# Patient Record
Sex: Male | Born: 1970 | Race: White | Hispanic: No | Marital: Married | Smoking: Never smoker
Health system: Southern US, Community
[De-identification: ages and names within clinical notes are randomized; demographics above are authoritative.]

---

## 2015-02-27 ENCOUNTER — Ambulatory Visit
Admit: 2015-02-27 | Discharge: 2015-02-27 | Disposition: A | Discharge status: Home / Self Care | Payer: BLUE CROSS/BLUE SHIELD | Attending: Family Medicine | Admitting: Family Medicine

## 2015-02-27 ENCOUNTER — Encounter: Payer: Self-pay | Admitting: Emergency Medicine

## 2015-02-27 ENCOUNTER — Ambulatory Visit
Admission: EM | Admit: 2015-02-27 | Discharge: 2015-02-27 | Disposition: A | Discharge status: Home / Self Care | Payer: BLUE CROSS/BLUE SHIELD | Attending: Family Medicine | Admitting: Family Medicine

## 2015-02-27 DIAGNOSIS — J020 Streptococcal pharyngitis: Secondary | ICD-10-CM | POA: Diagnosis not present

## 2015-02-27 DIAGNOSIS — E279 Disorder of adrenal gland, unspecified: Secondary | ICD-10-CM | POA: Insufficient documentation

## 2015-02-27 DIAGNOSIS — K5730 Diverticulosis of large intestine without perforation or abscess without bleeding: Secondary | ICD-10-CM | POA: Insufficient documentation

## 2015-02-27 DIAGNOSIS — K760 Fatty (change of) liver, not elsewhere classified: Secondary | ICD-10-CM | POA: Insufficient documentation

## 2015-02-27 DIAGNOSIS — R1031 Right lower quadrant pain: Secondary | ICD-10-CM | POA: Diagnosis not present

## 2015-02-27 DIAGNOSIS — R509 Fever, unspecified: Secondary | ICD-10-CM

## 2015-02-27 LAB — COMPREHENSIVE METABOLIC PANEL
ALBUMIN: 4.9 g/dL (ref 3.5–5.0)
ALT: 43 U/L (ref 17–63)
ANION GAP: 10 (ref 5–15)
AST: 29 U/L (ref 15–41)
Alkaline Phosphatase: 54 U/L (ref 38–126)
BILIRUBIN TOTAL: 0.8 mg/dL (ref 0.3–1.2)
BUN: 19 mg/dL (ref 6–20)
CHLORIDE: 103 mmol/L (ref 101–111)
CO2: 24 mmol/L (ref 22–32)
Calcium: 9.4 mg/dL (ref 8.9–10.3)
Creatinine, Ser: 1.09 mg/dL (ref 0.61–1.24)
GFR calc Af Amer: >60 mL/min (ref >60)
GFR calc non Af Amer: >60 mL/min (ref >60)
GLUCOSE: 125 mg/dL — AB (ref 65–99)
POTASSIUM: 3.7 mmol/L (ref 3.5–5.1)
SODIUM: 137 mmol/L (ref 135–145)
TOTAL PROTEIN: 7.8 g/dL (ref 6.5–8.1)

## 2015-02-27 LAB — CBC WITH DIFFERENTIAL/PLATELET
BASOS ABS: 0.1 10*3/uL (ref 0–0.1)
BASOS PCT: 1 %
EOS ABS: 0.1 10*3/uL (ref 0–0.7)
Eosinophils Relative: 0 %
HEMATOCRIT: 52.8 % — AB (ref 40.0–52.0)
Hemoglobin: 18.5 g/dL — ABNORMAL HIGH (ref 13.0–18.0)
Lymphocytes Relative: 7 %
Lymphs Abs: 1.4 10*3/uL (ref 1.0–3.6)
MCH: 30.3 pg (ref 26.0–34.0)
MCHC: 35.1 g/dL (ref 32.0–36.0)
MCV: 86.2 fL (ref 80.0–100.0)
MONO ABS: 1.5 10*3/uL — AB (ref 0.2–1.0)
MONOS PCT: 8 %
NEUTROS ABS: 17.4 10*3/uL — AB (ref 1.4–6.5)
Neutrophils Relative %: 84 %
PLATELETS: 229 10*3/uL (ref 150–440)
RBC: 6.12 MIL/uL — ABNORMAL HIGH (ref 4.40–5.90)
RDW: 13.2 % (ref 11.5–14.5)
WBC: 20.6 10*3/uL — ABNORMAL HIGH (ref 3.8–10.6)

## 2015-02-27 LAB — URINALYSIS COMPLETE WITH MICROSCOPIC (ARMC ONLY)
Bacteria, UA: NONE SEEN — AB
Bilirubin Urine: NEGATIVE
Glucose, UA: 100 mg/dL — AB
Hgb urine dipstick: NEGATIVE
KETONES UR: NEGATIVE mg/dL
LEUKOCYTES UA: NEGATIVE
Nitrite: NEGATIVE
PH: 5 (ref 5.0–8.0)
PROTEIN: NEGATIVE mg/dL
RBC / HPF: NONE SEEN RBC/hpf (ref <3)
SPECIFIC GRAVITY, URINE: 1.025 (ref 1.005–1.030)
SQUAMOUS EPITHELIAL / LPF: NONE SEEN — AB

## 2015-02-27 LAB — RAPID STREP SCREEN (MED CTR MEBANE ONLY): STREPTOCOCCUS, GROUP A SCREEN (DIRECT): POSITIVE — AB

## 2015-02-27 MED ORDER — IBUPROFEN 800 MG PO TABS
800.0000 mg | ORAL_TABLET | Freq: Once | ORAL | Status: AC
  Administered 2015-02-27: 800 mg via ORAL

## 2015-02-27 MED ORDER — IOHEXOL 300 MG/ML  SOLN
100.0000 mL | Freq: Once | INTRAMUSCULAR | Status: AC | PRN
  Administered 2015-02-27: 100 mL via INTRAVENOUS

## 2015-02-27 MED ORDER — AMOXICILLIN 875 MG PO TABS: 875.0000 mg | ORAL_TABLET | Freq: Two times a day (BID) | ORAL | Status: AC

## 2015-02-27 NOTE — ED Provider Notes (Signed)
[facility] Emergency Department Provider Note  ____________________________________________  Time seen: Approximately 4:44 PM  I have reviewed the triage vital signs and the nursing notes.   HISTORY  Chief Complaint Fever and Abdominal Pain   HPI Ryan Obrien is a 44 y.o. male presents for complaints of fever. Reports fever max today 102.5. States fever x one day. States took 2 Tylenol just prior to arrival. Patient reports visiting area for work, and states work up this am with slight sore throat and felt feverish. Also reports last week as getting out of bed he twisting awkwardly and pulled muscle in right groin. States he has had some intermittent right groin pain since and states pain was only with movement. States today felt that he has had some increased right groin pain and seems to be present during sitting as well as some movements. States has continued to eat and drink today, last ate 1230 pm. Denies vomiting, nausea, diarrhea or constipation. Denies sick contacts. Denies chest pain, shortness of breath or back pain. Denies dysuria, diarrhea or constipation. Denies bulging or masses in groin or abdomen. Denies recent strenuous activity, fall or injury. Denies pain radiation. Denies testicular or penile pain, swelling or discomfort.Denies rash, insect bites.   History reviewed. No pertinent past medical history.  There are no active problems to display for this patient.   History reviewed. No pertinent past surgical history.  Current Outpatient Rx  Name  Route  Sig  Dispense  Refill  . acetaminophen (TYLENOL) 500 MG tablet   Oral   Take 1,000 mg by mouth every 6 (six) hours as needed.           Allergies Review of patient's allergies indicates no known allergies.  History reviewed. No pertinent family history.  Social History Social History  Substance Use Topics  . Smoking status: Never Smoker   . Smokeless tobacco: None  . Alcohol Use:  Yes    Review of Systems Constitutional: Positive for fever. Eyes: No visual changes. ENT:  positive for sore throat. Cardiovascular: Denies chest pain. Respiratory: Denies shortness of breath. Gastrointestinal:  positive for abdominal pain and right groin pain as above. No nausea, no vomiting.  No diarrhea.  No constipation. Genitourinary: Negative for dysuria. Musculoskeletal: Negative for back pain. Skin: Negative for rash. Neurological: Negative for headaches, focal weakness or numbness.  10-point ROS otherwise negative.  ____________________________________________   PHYSICAL EXAM:  VITAL SIGNS: ED Triage Vitals  Enc Vitals Group     BP 02/27/15 1519 129/86 mmHg     Pulse Rate 02/27/15 1519 91     Resp 02/27/15 1519 16     Temp 02/27/15 1519 101.4 F (38.6 C)     Temp Source 02/27/15 1519 Oral     SpO2 02/27/15 1519 100 %     Weight 02/27/15 1519 224 lb (101.606 kg)     Height 02/27/15 1519 6' 1"  (1.854 m)     Head Cir --      [facility] --      Pain Score 02/27/15 1522 4     Pain Loc --      Pain Edu? --      Excl. in [place]   02/27/15 1519 02/27/15 1522 02/27/15 1711 02/27/15 1715  BP: 129/86  125/90   Pulse: 91  70   Temp: 101.4 F (38.6 C)  99.1 F (37.3 C)   TempSrc: Oral  Oral   Resp: 16  16   Height: 6' 1"  (1.854 m)     Weight: 224 lb (101.606 kg)     SpO2: 100%  100%   PainSc:  4   3     Constitutional: Alert and oriented. Well appearing and in no acute distress. Eyes: Conjunctivae are normal. PERRL. EOMI. Head: Atraumatic.  Ears: no erythema, normal TMs bilaterally.   Nose: No congestion/rhinnorhea.  Mouth/Throat: Mucous membranes are moist.  Mild pharyngeal erythema and tonsillar swelling. No tonsillar exudate. No uvular deviation or shift.  Neck: No stridor.  No cervical spine tenderness to palpation. Hematological/Lymphatic/Immunilogical: No cervical lymphadenopathy. Cardiovascular: Normal rate, regular rhythm. Grossly  normal heart sounds.  Good peripheral circulation. Respiratory: Normal respiratory effort.  No retractions. Lungs CTAB. Gastrointestinal: Soft. Right lower quadrant abdomen with intermittent mild TTP to palpation, right inguinal mild TTP intermittently, neither consistent. Pain unchanged with hip flexion, right leg abduction or adduction. Negative Psoas sign. Negative obturator sign. Mild rovsings sign. . No distention. Normal Bowel sounds.  No abdominal bruits. No CVA tenderness. Musculoskeletal: No lower or upper extremity tenderness nor edema.  No joint effusions. Bilateral pedal pulses equal and easily palpated.  Rectal/genitourinary: With RN Clair Gulling at bedside.  Nontender. Normal brown stool coloration. No testicular swelling or pain. No masses, bulges. Examined supine and standing.  Neurologic:  Normal speech and language. No gross focal neurologic deficits are appreciated. No gait instability. Skin:  Skin is warm, dry and intact. No rash noted. Psychiatric: Mood and affect are normal. Speech and behavior are normal.  ____________________________________________   LABS (all labs ordered are listed, but only abnormal results are displayed)  Labs Reviewed  RAPID STREP SCREEN (NOT AT [facility]) - Abnormal; Notable for the following:    Streptococcus, Group A Screen (Direct) POSITIVE (*)    All other components within normal limits  URINALYSIS COMPLETEWITH MICROSCOPIC ([facility] ONLY) - Abnormal; Notable for the following:    Glucose, UA 100 (*)    Bacteria, UA NONE SEEN (*)    Squamous Epithelial / LPF NONE SEEN (*)    All other components within normal limits  CBC WITH DIFFERENTIAL/PLATELET - Abnormal; Notable for the following:    WBC 20.6 (*)    RBC 6.12 (*)    Hemoglobin 18.5 (*)    HCT 52.8 (*)    Neutro Abs 17.4 (*)    Monocytes Absolute 1.5 (*)    All other components within normal limits  COMPREHENSIVE METABOLIC PANEL - Abnormal; Notable for the following:    Glucose, Bld 125 (*)     All other components within normal limits   RADIOLOGY EXAM: CT ABDOMEN AND PELVIS WITH CONTRAST  TECHNIQUE: Multidetector CT imaging of the abdomen and pelvis was performed using the standard protocol following bolus administration of intravenous contrast.  CONTRAST: 117m OMNIPAQUE IOHEXOL 300 MG/ML SOLN  COMPARISON: None.  FINDINGS: Lower chest: Mild bibasilar atelectasis  Hepatobiliary: Mild hepatic steatosis  Pancreas: negative  Spleen: negative  Adrenals/Urinary Tract: 2 cm right adrenal mass of average attenuation value of 25. Otherwise negative.  Stomach/Bowel: Appendix normal. Sigmoid diverticulosis.  Vascular/Lymphatic: negative  Reproductive: negative  Other: negative  Musculoskeletal: negative  IMPRESSION: Mild hepatic steatosis.  Sigmoid diverticulosis without acute inflammation.  2cm right adrenal mass, possibly an adenoma. Recommend Adrenal-Protocol MRI to confirm this.  These results will be called to the ordering clinician or representative by the Radiology Department at the imaging location.   Electronically Signed  By: RSkipper ClicheM.D.  On: 02/27/2015 18:53  ____________________________________________  INITIAL IMPRESSION / ASSESSMENT AND PLAN / ED COURSE  Pertinent labs & imaging results that were available during my care of the patient were reviewed by me and considered in my medical decision making (see chart for details).  Presents for one day of sore throat, fever and intermittent right groin and right abdominal pain. Will evaluate for strep throat. Abdominal and right inguinal exam with inconsistent tenderness to palpation. Will treat fever with oral ibuprofen and evaluate cbc and cmp.  Strep swab positive. Abdominal and right inguinal exam with TTP remains inconsistent with pain present then denies pain to palpation. Labs reviewed. WBC 20.6. Patient with positive strep throat but also with  elevated WBC concern for possible appendicitis as intermittent vague right lower abdominal pain. Discussed patient and plan of care with Dr Alveta Heimlich who also agrees to plan. Discussed with patient plan of care and ability to further evaluate. Counseled regarding Ct abdomen and pelvis for further evaluation, vs treat for strep and very close monitoring. Patient reports would like to proceed with CT abdomen at this time.   Unable to Ct at this facility at this time. RN to call to schedule for outpatient CT abdomen and pelvis. Patient verbalized understanding and agreed to plan. Patient alert and oriented with decisional capacity. Patient coworker at bedside to transport patient. Awaiting CT results. ____________________________________________   FINAL CLINICAL IMPRESSION(S) / ED DIAGNOSES  Final diagnoses:  Strep throat  Fever, unspecified fever cause  Right lower quadrant abdominal pain       Marylene Land, NP 02/27/15 1841   ADDENDUM 02/27/2015  1900  CT results reviewed. See full report below. Called and discussed findings with patient. Further discussed incidental CT findings with patient. Patient also verbalizes he will stop by urgent care in the next 2 days for full report of the CT to take to his primary care physician for follow-up. CT abdomen with normal appendix. CT abdomen and pelvis showing sigmoid diverticulosis without acute inflammation, 2 cm right adrenal mass, possibly an adenoma and radiologist recommended adrenal protocol MRI to confirm this, mild hepatic steatosis. Patient verbalized understanding and reports he will follow-up with his primary care physician within a week. States that he returns home this Friday.  Reports that he is feeling well. Will treat strep throat with oral amoxicillin, prescription sent to [facility] in [place] per patient request. . Discussed supportive treatments including when necessary Tylenol or ibuprofen and rest. Encourage fluids. Discussed strict  follow-up and return parameters. suspect right groin and right lower abdominal pain which is intermittent and worse with movement likely musculoskeletal strain per subjective report above. Discussed strict follow up and return parameters, including PCP follow up within one week. Discussed returning for weakness, fever not responding to medication, increased pain, difficulty swallowing, new or worsening concerns.  Discussed follow-up and return parameters. Patient verbalized understanding and agreed to plan.   EXAM: CT ABDOMEN AND PELVIS WITH CONTRAST  TECHNIQUE: Multidetector CT imaging of the abdomen and pelvis was performed using the standard protocol following bolus administration of intravenous contrast.  CONTRAST: 163m OMNIPAQUE IOHEXOL 300 MG/ML SOLN  COMPARISON: None.  FINDINGS: Lower chest: Mild bibasilar atelectasis  Hepatobiliary: Mild hepatic steatosis  Pancreas: negative  Spleen: negative  Adrenals/Urinary Tract: 2 cm right adrenal mass of average attenuation value of 25. Otherwise negative.  Stomach/Bowel: Appendix normal. Sigmoid diverticulosis.  Vascular/Lymphatic: negative  Reproductive: negative  Other: negative  Musculoskeletal: negative  IMPRESSION: Mild hepatic steatosis.  Sigmoid diverticulosis without acute inflammation.  2cm right adrenal mass,  possibly an adenoma. Recommend Adrenal-Protocol MRI to confirm this.  These results will be called to the ordering clinician or representative by the Radiology Department at the imaging location.   Electronically Signed  By: Skipper Cliche M.D.  On: 02/27/2015 18:53  Marylene Land, NP 02/27/15 2214

## 2015-02-27 NOTE — ED Notes (Signed)
Patient c/o RLQ abdominal pain and fever since last night.

## 2015-02-27 NOTE — Discharge Instructions (Signed)
Proceed to hospital as directed for Ct abdomen for further evaluation. Do not eat or drink. Stay at hospital until further directed.    Abdominal Pain Many things can cause abdominal pain. Usually, abdominal pain is not caused by a disease and will improve without treatment. It can often be observed and treated at home. Your health care provider will do a physical exam and possibly order blood tests and X-rays to help determine the seriousness of your pain. However, in many cases, more time must pass before a clear cause of the pain can be found. Before that point, your health care provider may not know if you need more testing or further treatment. HOME CARE INSTRUCTIONS  Monitor your abdominal pain for any changes. The following actions may help to alleviate any discomfort you are experiencing:  Only take over-the-counter or prescription medicines as directed by your health care provider.  Do not take laxatives unless directed to do so by your health care provider.  Try a clear liquid diet (broth, tea, or water) as directed by your health care provider. Slowly move to a bland diet as tolerated. SEEK MEDICAL CARE IF:  You have unexplained abdominal pain.  You have abdominal pain associated with nausea or diarrhea.  You have pain when you urinate or have a bowel movement.  You experience abdominal pain that wakes you in the night.  You have abdominal pain that is worsened or improved by eating food.  You have abdominal pain that is worsened with eating fatty foods.  You have a fever. SEEK IMMEDIATE MEDICAL CARE IF:   Your pain does not go away within 2 hours.  You keep throwing up (vomiting).  Your pain is felt only in portions of the abdomen, such as the right side or the left lower portion of the abdomen.  You pass bloody or black tarry stools. MAKE SURE YOU:  Understand these instructions.   Will watch your condition.   Will get help right away if you are not doing  well or get worse.  Document Released: 03/12/2005 Document Revised: 06/07/2013 Document Reviewed: 02/09/2013 [facility] Patient Information 2015 [place], [place]. This information is not intended to replace advice given to you by your health care provider. Make sure you discuss any questions you have with your health care provider.  Strep Throat Strep throat is an infection of the throat caused by a bacteria named Streptococcus pyogenes. Your health care provider may call the infection streptococcal "tonsillitis" or "pharyngitis" depending on whether there are signs of inflammation in the tonsils or back of the throat. Strep throat is most common in children aged 5-15 years during the cold months of the year, but it can occur in people of any age during any season. This infection is spread from person to person (contagious) through coughing, sneezing, or other close contact. SIGNS AND SYMPTOMS   Fever or chills.  Painful, swollen, red tonsils or throat.  Pain or difficulty when swallowing.  White or yellow spots on the tonsils or throat.  Swollen, tender lymph nodes or "glands" of the neck or under the jaw.  Red rash all over the body (rare). DIAGNOSIS  Many different infections can cause the same symptoms. A test must be done to confirm the diagnosis so the right treatment can be given. A "rapid strep test" can help your health care provider make the diagnosis in a few minutes. If this test is not available, a light swab of the infected area can be used for a  throat culture test. If a throat culture test is done, results are usually available in a day or two. TREATMENT  Strep throat is treated with antibiotic medicine. HOME CARE INSTRUCTIONS   Gargle with 1 tsp of salt in 1 cup of warm water, 3-4 times per day or as needed for comfort.  Family members who also have a sore throat or fever should be tested for strep throat and treated with antibiotics if they have the strep infection.  Make  sure everyone in your household washes their hands well.  Do not share food, drinking cups, or personal items that could cause the infection to spread to others.  You may need to eat a soft food diet until your sore throat gets better.  Drink enough water and fluids to keep your urine clear or pale yellow. This will help prevent dehydration.  Get plenty of rest.  Stay home from school, day care, or work until you have been on antibiotics for 24 hours.  Take medicines only as directed by your health care provider.  Take your antibiotic medicine as directed by your health care provider. Finish it even if you start to feel better. SEEK MEDICAL CARE IF:   The glands in your neck continue to enlarge.  You develop a rash, cough, or earache.  You cough up green, yellow-brown, or bloody sputum.  You have pain or discomfort not controlled by medicines.  Your problems seem to be getting worse rather than better.  You have a fever. SEEK IMMEDIATE MEDICAL CARE IF:   You develop any new symptoms such as vomiting, severe headache, stiff or painful neck, chest pain, shortness of breath, or trouble swallowing.  You develop severe throat pain, drooling, or changes in your voice.  You develop swelling of the neck, or the skin on the neck becomes red and tender.  You develop signs of dehydration, such as fatigue, dry mouth, and decreased urination.  You become increasingly sleepy, or you cannot wake up completely. MAKE SURE YOU:  Understand these instructions.  Will watch your condition.  Will get help right away if you are not doing well or get worse. Document Released: 05/30/2000 Document Revised: 10/17/2013 Document Reviewed: 08/01/2010 [facility] Patient Information 2015 [place], [place]. This information is not intended to replace advice given to you by your health care provider. Make sure you discuss any questions you have with your health care provider.

## 2016-11-05 IMAGING — CT CT ABD-PELV W/ CM
1 of 3 series · 14 of 32 positions shown, 19 images · IV contrast (omnipaque)
Comparison: None.

CLINICAL DATA: RLQ abdomen pain x 1 week

EXAM:
CT ABDOMEN AND PELVIS WITH CONTRAST
TECHNIQUE: Multidetector CT imaging of the abdomen and pelvis was performed
using the standard protocol following bolus administration of
intravenous contrast.
CONTRAST:  100mL OMNIPAQUE IOHEXOL 300 MG/ML  SOLN

[Series 2: routine abd pel with · axial · 0.80mm/px · z∈[-1000,-524]mm · 14 of 107 slices shown, 19 images]
[im 6/107  soft-tissue]
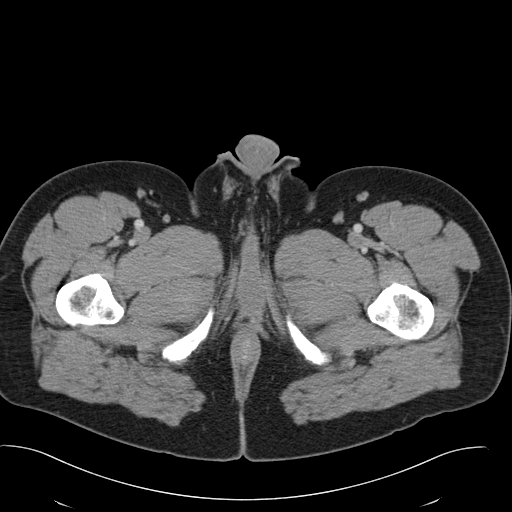
[im 6/107  bone]
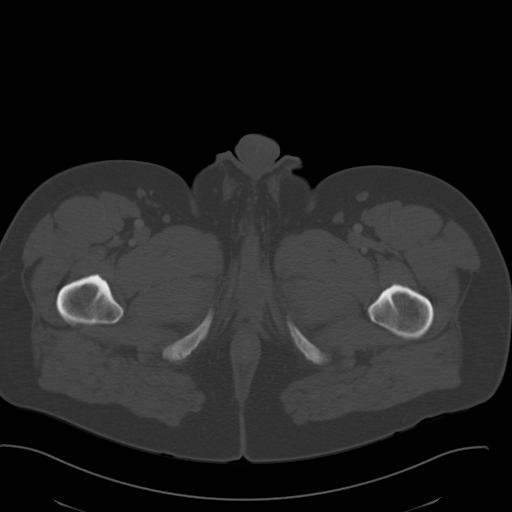
[im 17/107  soft-tissue]
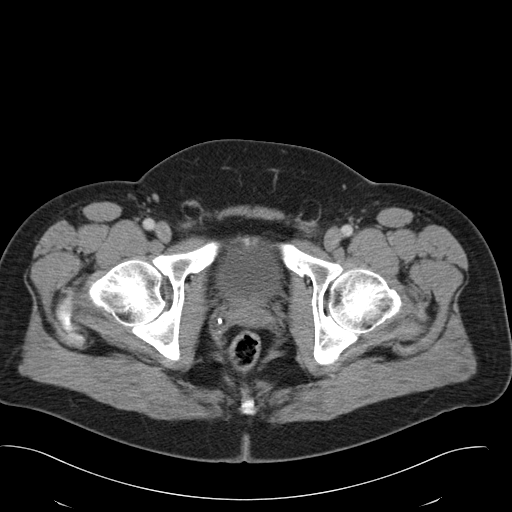
[im 23/107  soft-tissue]
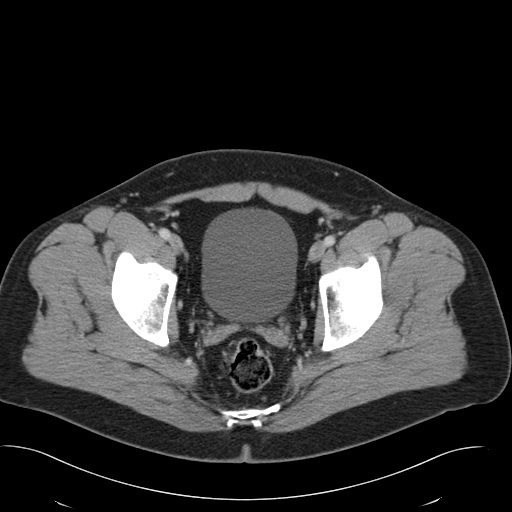
[im 28/107  soft-tissue]
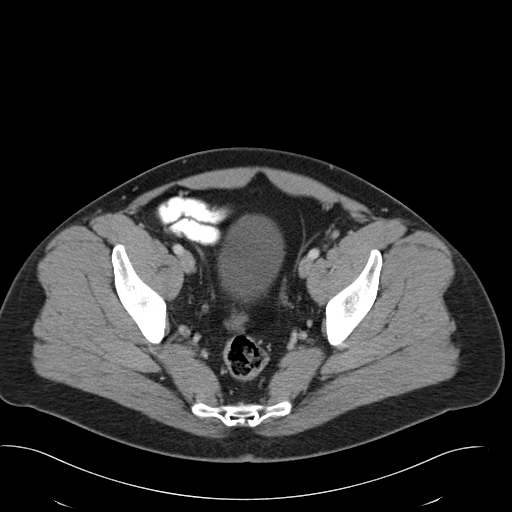
[im 40/107  soft-tissue]
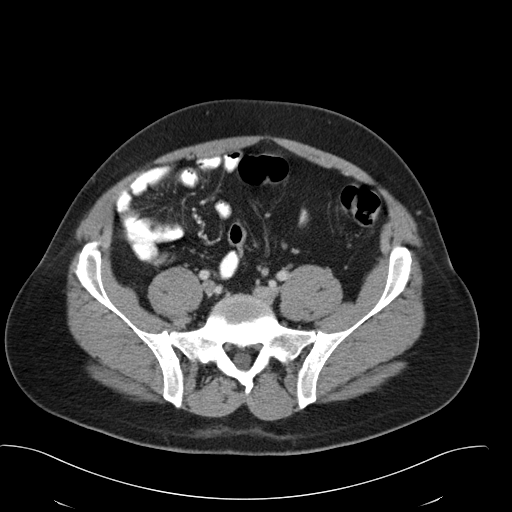
[im 45/107  soft-tissue]
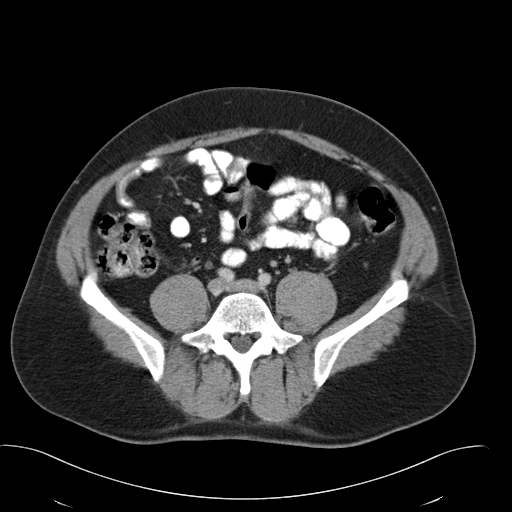
[im 56/107  soft-tissue]
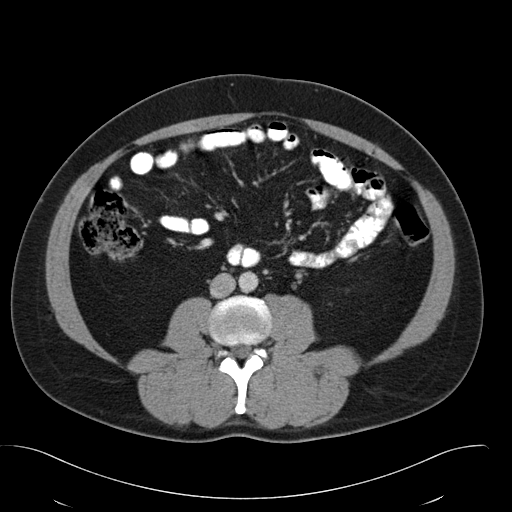
[im 62/107  soft-tissue]
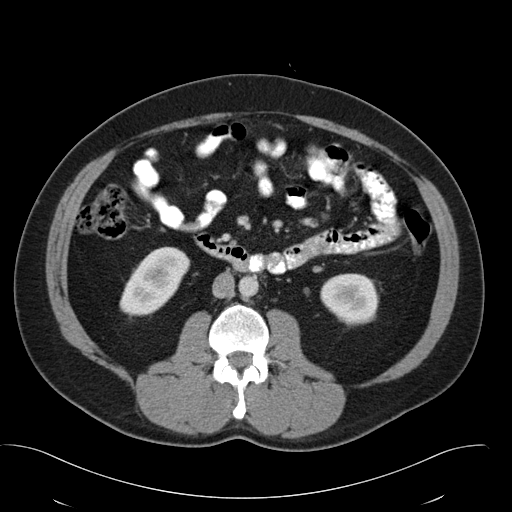
[im 67/107  soft-tissue]
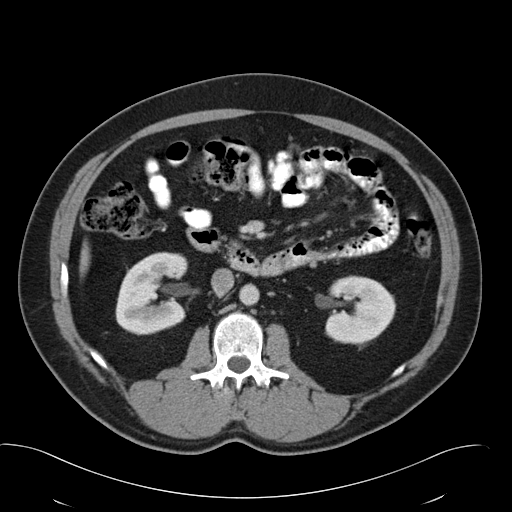
[im 67/107  bone]
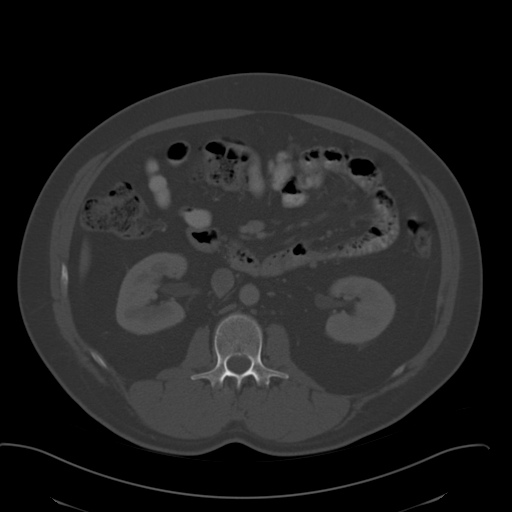
[im 79/107  soft-tissue]
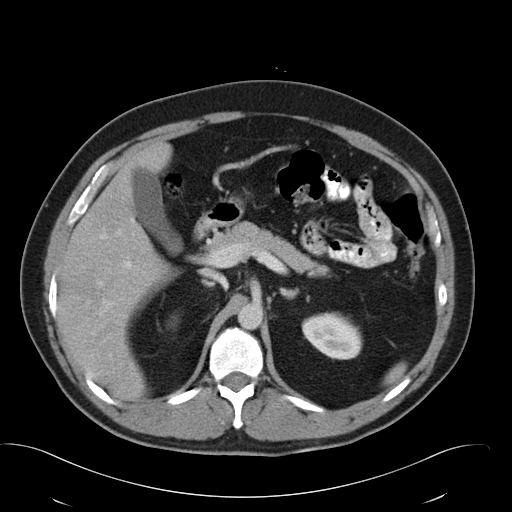
[im 84/107  soft-tissue]
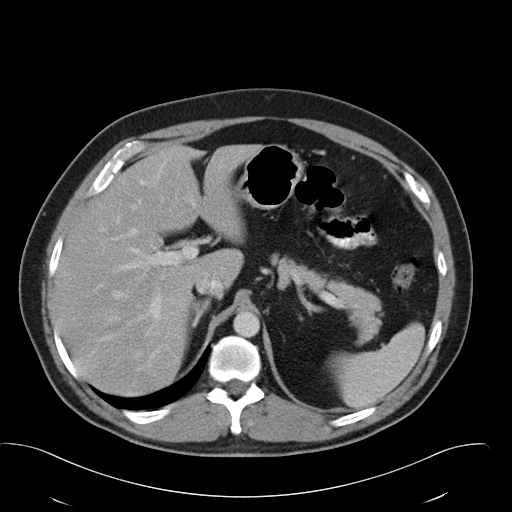
[im 84/107  lung]
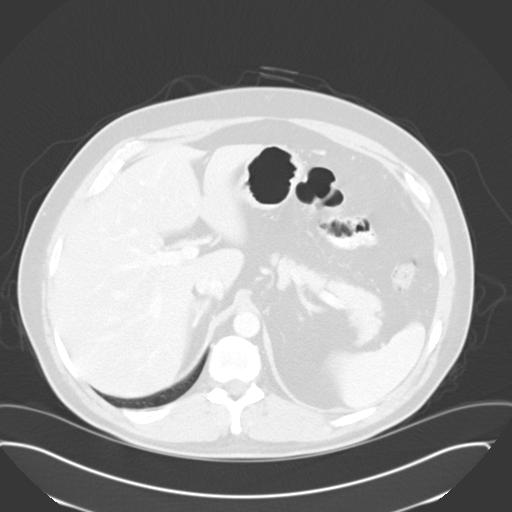
[im 90/107  soft-tissue]
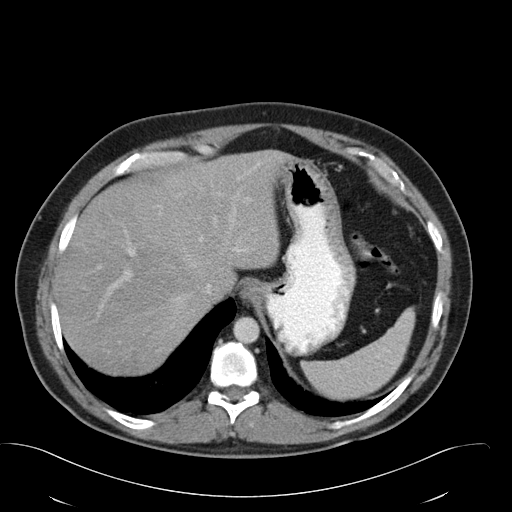
[im 90/107  lung]
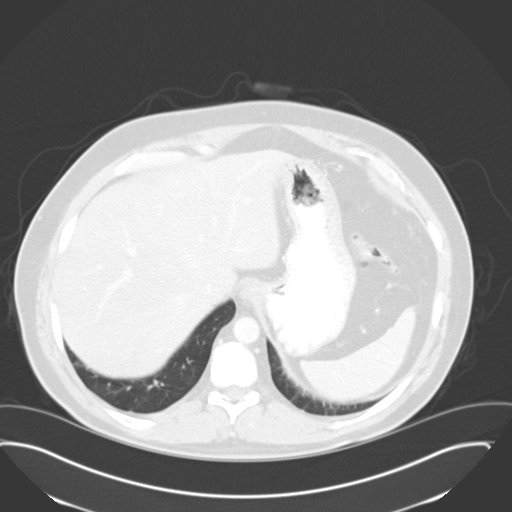
[im 95/107  lung]
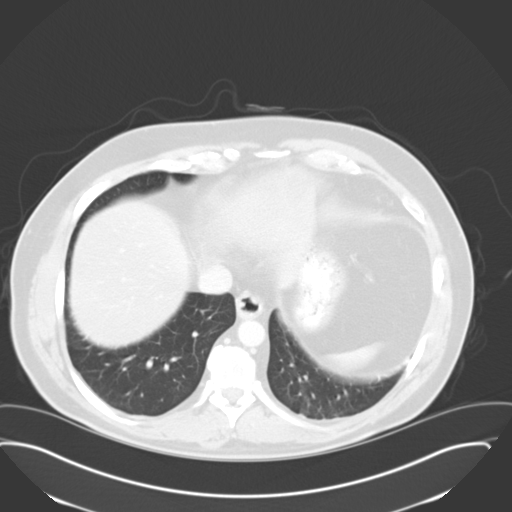
[im 101/107  soft-tissue]
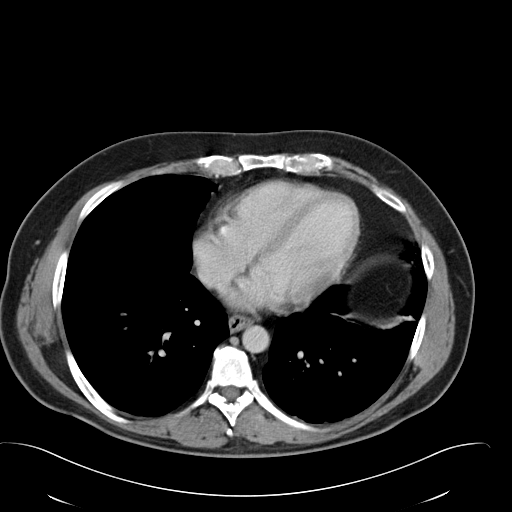
[im 101/107  lung]
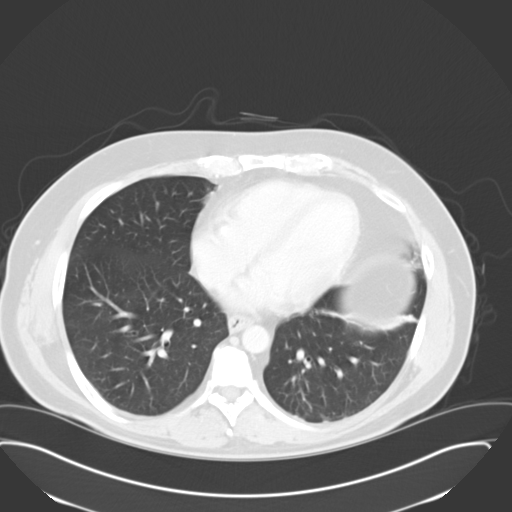

[14 of 32 positions shown; findings below may reference images not displayed]

FINDINGS: Lower chest:  Mild bibasilar atelectasis

Hepatobiliary:  Mild hepatic steatosis

Pancreas: negative

Spleen: negative

Adrenals/Urinary Tract: 2 cm right adrenal mass of average
attenuation value of 25. Otherwise negative.

Stomach/Bowel:  Appendix normal.  Sigmoid diverticulosis.

Vascular/Lymphatic: negative

Reproductive: negative

Other: negative

Musculoskeletal: negative
IMPRESSION: Mild hepatic steatosis.

Sigmoid diverticulosis without acute inflammation.

2cm right adrenal mass, possibly an adenoma. Recommend
Adrenal-Protocol MRI to confirm this.

These results will be called to the ordering clinician or
representative by the [HOSPITAL] at the imaging location.
